# Patient Record
Sex: Female | Born: 1965 | Race: White | Hispanic: No
Health system: Southern US, Community
[De-identification: ages and names within clinical notes are randomized; demographics above are authoritative.]

---

## 1998-11-14 ENCOUNTER — Other Ambulatory Visit: Admission: RE | Admit: 1998-11-14 | Discharge: 1998-11-14 | Payer: Self-pay | Admitting: *Deleted

## 2000-04-30 ENCOUNTER — Other Ambulatory Visit: Admission: RE | Admit: 2000-04-30 | Discharge: 2000-04-30 | Payer: Self-pay | Admitting: Obstetrics and Gynecology

## 2000-11-14 ENCOUNTER — Inpatient Hospital Stay (HOSPITAL_COMMUNITY): Admission: AD | Admit: 2000-11-14 | Discharge: 2000-11-17 | Payer: Self-pay | Admitting: Obstetrics and Gynecology

## 2000-11-14 ENCOUNTER — Encounter (INDEPENDENT_AMBULATORY_CARE_PROVIDER_SITE_OTHER): Payer: Self-pay

## 2000-12-24 ENCOUNTER — Other Ambulatory Visit: Admission: RE | Admit: 2000-12-24 | Discharge: 2000-12-24 | Payer: Self-pay | Admitting: Obstetrics & Gynecology

## 2002-03-07 ENCOUNTER — Other Ambulatory Visit: Admission: RE | Admit: 2002-03-07 | Discharge: 2002-03-07 | Payer: Self-pay | Admitting: Obstetrics & Gynecology

## 2006-03-09 ENCOUNTER — Inpatient Hospital Stay (HOSPITAL_COMMUNITY): Admission: AD | Admit: 2006-03-09 | Discharge: 2006-03-09 | Payer: Self-pay | Admitting: Obstetrics and Gynecology

## 2007-01-11 ENCOUNTER — Encounter: Admission: RE | Admit: 2007-01-11 | Discharge: 2007-02-07 | Payer: Self-pay | Admitting: Family Medicine

## 2007-12-26 ENCOUNTER — Emergency Department (HOSPITAL_COMMUNITY): Admission: EM | Admit: 2007-12-26 | Discharge: 2007-12-26 | Payer: Self-pay | Admitting: Emergency Medicine

## 2008-01-20 ENCOUNTER — Ambulatory Visit: Payer: Self-pay | Admitting: Cardiology

## 2008-02-17 ENCOUNTER — Ambulatory Visit: Payer: Self-pay | Admitting: Cardiology

## 2008-02-17 ENCOUNTER — Ambulatory Visit: Payer: Self-pay

## 2011-01-20 NOTE — Assessment & Plan Note (Signed)
[facility]ALTHCARE                            CARDIOLOGY OFFICE NOTE   NAME:Diana Hardin, Diana Hardin                         MRN:          [id number]  DATE:01/20/2008                            DOB:          07/15/1966    REFERRING PHYSICIAN:  Bethann Berkshire, MD   PRIMARY CARE PHYSICIAN:  None.   REASON FOR PRESENTATION:  Evaluate patient with chest pain.   HISTORY OF PRESENT ILLNESS:  The patient is a pleasant 45 year old white  female without prior cardiac history.  She was seen in the emergency  room on December 26, 2007 and then was referred here by Dr. Estell Harpin.  She  was having chest discomfort.  She says she has had about 3 severe  episodes of this.  She had an episode at work in March.  She had some  chest tightness while seated at her desk.  This went away spontaneously.  She had another episode while getting a manicure on her birthday in  early April.  The most severe was on December 26, 2007.  At that time, she  had chest discomfort with some right arm discomfort.  She tried to drive  to the hospital but felt poorly and so flagged down a fire truck and was  taken by EMS.  She had on that day substernal discomfort.  It was sharp.  It was moderate.  It was lasting for a few hours.  She was quite anxious  with this.  She felt her heart racing.  She felt a little short of  breath.  She thought she was starting to panic.  It started at rest.  She did get some sublingual nitroglycerin in the ambulance.  She said  the discomfort slowly eased.  In the emergency room, she did not have  any discomfort.  She had no acute EKG changes.  One set of cardiac  enzymes were negative.  She was discharged from the emergency room.  The  patient did not have associated nausea, vomiting, or diaphoresis.  This  had been similar to the previous episodes, but she not had it prior to  March.  Since that time, she has not had any of these severe episodes,  but she has had some fleeting chest  discomfort and occasionally a  lingering ache.  She is somewhat active, though she does not excise  routinely.  She last went walking a couple of days ago.  This was not  brisk.  She pushes a vacuum cleaner.  With this kind of activity, she  does not get chest pressure, neck or arm discomfort.  She does not get  palpitations, presyncope, or syncope.  She had no PND or orthopnea.   PAST MEDICAL HISTORY:  1. Questionable mitral valve prolapse diagnosed by echo but felt to be      mild.  2. She has no history of hypertension, diabetes.   PAST SURGICAL HISTORY:  1. D&C.  2. Adenoidectomy.   ALLERGIES:  DARVOCET.   MEDICATIONS:  1. Aspirin 81 mg daily.  2. Multivitamin.  3. Vitamin C.   SOCIAL  HISTORY:  The patient is married.  She has one 63-year-old  daughter.  She is a Runner, broadcasting/film/video.  She smoked for 7 years, less than 1 pack a  day, quitting in 2000.  She drinks alcohol socially.   FAMILY HISTORY:  Contributory for father dying of myocardial infarction  at age 39.  However, he had an abusive lifestyle, abusing alcohol and  cigarettes.  Her mother has had a CVA and is alive in her 59s.  She  apparently was on migraine medications and other medications thought to  be related to her CVA.  She second-degree relatives with later-onset  heart disease.  She comes from a small family.   REVIEW OF SYSTEMS:  As stated in HPI and otherwise negative for other  systems.   PHYSICAL EXAMINATION:  GENERAL:  The patient is in no distress.  VITAL SIGNS:  Blood pressure 117/79, heart rate 91 and regular, body  mass index 24.  HEENT:  Eyelids unremarkable.  Pupils equal, round, and reactive to  light.  Fundi not visualized.  Oral mucosa unremarkable.  NECK:  No jugular distention at 45 degrees.  Carotid upstroke brisk and  symmetric.  No bruits, no thyromegaly.  LYMPHATICS:  No cervical, axillary, or inguinal.  LUNGS:  Clear to auscultation bilaterally.  BACK:  No costovertebral angle tenderness.   CHEST:  Unremarkable.  HEART:  PMI not displaced or sustained, S1-S2 within normal limits.  No  S3-S4.  No clicks, rubs, murmurs.  ABDOMEN:  Flat, positive bowel sounds, normal in frequency and pitch.  No bruits, rebound, guarding, no midline pulsatile mass.  No  hepatosplenomegaly, no splenomegaly.  SKIN:  No rash, no nodules.  EXTREMITIES:  2+ pulses throughout.  No edema, cyanosis or clubbing.  NEUROLOGIC:  Oriented to person, place, time.  Cranial nerves II-XII  grossly intact.  Motor grossly intact.   EKG (done at the ER on December 26, 2007) demonstrated sinus rhythm, axis  within normal limits, intervals within normal limits, questionable left  atrial enlargement, no acute ST-T wave changes.   ASSESSMENT/PLAN:  1. Chest discomfort.  The patient's chest discomfort is somewhat      atypical.  She does have a family history but no other risk      factors.  I think the pretest probability of obstructive coronary      disease is moderately low.  I think screening with a plain old      exercise treadmill (POET) will be helpful.  This will allow me to      rule out obstructive coronary disease with a good degree of      certainty, risk stratify and give her a prescription for exercise.  2. Mitral valve prolapse.  I do not appreciate any click or murmur.  I      do not think any further evaluation is warranted.  3. Palpitations.  The patient does describe some increased heart rate      with these activities.  She describes feeling very anxious.  There      may be a component of anxiety, and she should have this addressed      by a primary care physician or other primary Doneta Bayman, as she      admits to this becoming a more frequent issue.  4. Risk reduction.  5. Dyslipidemia.  The patient reports some mild elevation of low-      density lipoprotein but with a good high-density lipoprotein.  She  is going to try to find these labs, and I will review them.  They      were done about a  year ago.   FOLLOW UP:  Followup will be at the time of her POET.     Rollene Rotunda, MD, [facility]  Electronically Signed    JH/MedQ  DD: 01/20/2008  DT: 01/20/2008  Job #: [id number]   cc:   Bethann Berkshire, MD

## 2011-01-20 NOTE — Procedures (Signed)
[facility] HEALTHCARE                              EXERCISE TREADMILL   NAME:Diana Hardin, Diana Hardin                         MRN:          [id number]  DATE:02/17/2008                            DOB:          December 12, 1965    REFERRING PHYSICIAN:  Bethann Berkshire, MD.   PROCEDURE:  Exercise treadmill test.   INDICATIONS:  Evaluate patient with chest pain.   PROCEDURE NOTE:  The patient was exercised using standard Bruce  protocol.  She was able to exercise for 9 minutes and 30 seconds.  This  achieved 10.9 mets.  This was 30 seconds into stage IV.  The test was  terminated because of fatigue and because she had achieved target heart  rate.  She had a maximal blood pressure of 155/98 which was appropriate.  Her maximum heart rate was 160 which is 89% of predicted.  She had a  normal heart rate recovery.  There re was rare ventricular ectopy at  peak exercise.  There were no ischemic ST/T wave changes.  She had no  chest pain.  She had appropriate shortness of breath.   CONCLUSION:  Negative adequate exercise treadmill test.   PLAN:  Based on the above, no further cardiovascular testing is  suggested.  There is no evidence of high-grade obstructive coronary  artery disease.  I gave her a prescription for exercise and  recommendations.  She can come back to see me if she has any questions  or future problems.     Rollene Rotunda, MD, [facility]  Electronically Signed    JH/MedQ  DD: 02/17/2008  DT: 02/18/2008  Job #: [id number]

## 2011-01-23 NOTE — H&P (Signed)
[facility]  Patient:    Diana Hardin, Diana Hardin                         MRN: [id number] Adm. Date:  [id number] Attending:  Silverio Lay A                         History and Physical  REASON FOR ADMISSION:         Intrauterine pregnancy, at 41 weeks and two days with regular uterine contraction.  HISTORY OF PRESENT ILLNESS:   This is a 45 year old, married, white female, gravida 1, para 0, with a due date of November 05, 2000, being admitted for regular uterine contraction which started about 2:30 this morning.  At the time of the contractions starting, the patient notes a moderate amount of water leaking, has not leaked since.  She has had bloody show since her last examination in the office on Friday where she was 3 cm dilated.  She reports good fetal activity and denies any symptoms of pregnancy-induced hypertension.  PRENATAL COURSE:              Reveals blood type O positive.  RPR nonreactive. Rubella not immune.  HBsAg negative.  HIV negative.  Varicella positive.  Pap smear normal.  Gonorrhea negative.  Chlamydia negative.  A 16-week AFP was within normal limits.  A 21-week ultrasound revealed a normal anatomy survey with a posterior placenta and normal cervical ______.  A 28-week glucose tolerance test was within normal limits.  A 35-week group B strep was positive.  A 40-week ultrasound revealed an estimated fetal weight at 50 seconds percentile and amniotic fluid index borderline at 8.9 or 17th percentile.  Prenatal course was otherwise remarkable for gestational thrombocytopenia evaluated by hematologist and last platelet count on March 1 was 123.  The patient was scheduled for induction on November 12, 2000, but declined.  PAST MEDICAL HISTORY:         Hypersensitivity to DARVOCET.  History of cryotherapy in 1987.  History of mitral valve prolapse, not needing premedication.  FAMILY HISTORY:               Father deceased of a massive myocardial  infarct at age 40.  Maternal grandmother deceased of a myocardial infarction at age 58.  Father was also known for hypertension.  SOCIAL HISTORY:               Married, nonsmoker, is a Runner, broadcasting/film/video at [employer].  PHYSICAL EXAMINATION:  VITAL SIGNS:                  Vital signs normal.  HEENT:                        Head, eyes, ears, nose, and throat negative.  LUNGS:                        Clear.  HEART:                        Normal.  ABDOMEN:                      Gravid, nontender.  Fetal heart rate tracings reactive.  PELVIC:  Vaginal examination upon admission 6-7 cm by admitting nurse, 100% effaced, vertex, -1-0.  EXTREMITIES:                  Negative.  ASSESSMENT:                   Intrauterine pregnancy, at 41 weeks and two days, in active labor, rubella not immune.  PLAN:                         The patient is admitted to labor and delivery today with vaginal delivery expected.  Pain management. DD:  11/14/00 TD:  11/14/00 Job: [id number]

## 2011-01-23 NOTE — Op Note (Signed)
[facility]  Patient:    VICKEE, MORMINO                         MRN: [id number] Adm. Date:  [id number] Attending:  Silverio Lay A                           Operative Report  PREOPERATIVE DIAGNOSIS:       Postpartum retained placenta.  POSTOPERATIVE DIAGNOSIS:      Placenta accreta with postoperative uterine atony.  SURGEON:                      Silverio Lay, M.D.  ANESTHESIA:                   MAC.  ANESTHESIOLOGIST:             Ellison Hughs., M.D.  PROCEDURE:                    Manual removal of placenta with curettage.  ESTIMATED BLOOD LOSS:         1000 cc.  DESCRIPTION OF PROCEDURE:     This is a 45 year old married white female, gravida 1, with a due date of November 05, 2000 who was admitted in continuous labor at 41 weeks and 2 days with a pregnancy which was complicated by gestational thrombocytopenia and a history of group B Strep positive.  her labor progressed rapidly and she was completed by 8:45 a.m. this morning. Second stage of labor was greatly prolonged and, after 1-1/2 hours of pushing with little progress, the patient was offered a saddle block without any complications.  At the time, her platelet count was 80,000.  After resting for almost an hour, she started pushing again four 1 hour and 45 minutes but, after a small application of vacuum extraction, we assisted the spontaneous vaginal delivery of a female infant at 1:12 with Apgars of 9 and 9.  A midline episiotomy was repaired with Monocryl 3-0 and we awaited delivery of the placenta.  After one hour, despite uterine massage and good contractions, and despite Valsalva maneuvers and nursing, the placenta remained attached.  The patient showed no active bleeding and vital signs remained normal.  Due to retained placenta, the decision was made to go to the operating room to proceed with manual removal and curettage under MAC sedation.  Informed consent was obtained  after reviewing the possible complications with both the patient and her husband regarding bleeding and infection.  The patient was taken to OR #4, was given IV sedation and was placed in the lithotomy position.  She was prepped and draped in a sterile fashion and a Foley catheter was placed.  I proceeded with attempts to manually remove the placenta, which now clearly was placenta accreta on an average surface of about 25%.  Removal was extremely difficult, and it was impossible to be completely assured that all placental tissue was removed.  At the end of removal, the placenta was reconstituted on the table and seemed to be complete.  We then proceeded with a very large curet to curet all uterine walls, which removed a small amount of membranes, but no frank placenta. After this was done, the patient started bleeding heavily from uterine atony. Her existing IV was reinforced with 40 units of Pitocin at high rate and she was given one dose  of Methergine in her left thigh.  After ten minutes, bleeding was still substantial and she was given a dose of Hemabate in her right thigh.  This medical therapy, coupled with aggressive bimanual uterine massage resolved the active bleeding and the uterus remained firm.  The patient was then taken to the PACU and a Stat CBC, DIC panel, typed and crossed was ordered.  Estimated blood loss during the procedure was about 1000 cc.  Instrument and sponge count was complete x 2. DD:  11/14/00 TD:  11/15/00 Job: [id number]

## 2011-06-02 LAB — DIFFERENTIAL
Basophils Absolute: 0
Basophils Relative: 0
Neutro Abs: 5.6
Neutrophils Relative %: 80 — ABNORMAL HIGH

## 2011-06-02 LAB — POCT I-STAT, CHEM 8
Creatinine, Ser: 0.8
HCT: 38
Hemoglobin: 12.9
Potassium: 4.5
Sodium: 137

## 2011-06-02 LAB — CBC
MCHC: 34.2
RBC: 3.73 — ABNORMAL LOW
RDW: 13.4

## 2011-06-02 LAB — POCT CARDIAC MARKERS
CKMB, poc: <1 — ABNORMAL LOW
Myoglobin, poc: 23.7

## 2011-06-02 LAB — D-DIMER, QUANTITATIVE: D-Dimer, Quant: <0.22

## 2018-05-24 ENCOUNTER — Other Ambulatory Visit: Payer: Self-pay | Admitting: Family Medicine

## 2018-05-24 DIAGNOSIS — R51 Headache: Principal | ICD-10-CM

## 2018-05-24 DIAGNOSIS — R519 Headache, unspecified: Secondary | ICD-10-CM

## 2019-06-02 ENCOUNTER — Other Ambulatory Visit: Payer: Self-pay

## 2019-06-02 DIAGNOSIS — Z20822 Contact with and (suspected) exposure to covid-19: Secondary | ICD-10-CM

## 2019-06-03 LAB — NOVEL CORONAVIRUS, NAA: SARS-CoV-2, NAA: NOT DETECTED

## 2019-09-14 ENCOUNTER — Telehealth: Payer: Self-pay | Admitting: Physician Assistant

## 2019-09-14 DIAGNOSIS — Z20822 Contact with and (suspected) exposure to covid-19: Secondary | ICD-10-CM

## 2019-09-14 NOTE — Progress Notes (Signed)
E-Visit for Corona Virus Screening   Your current symptoms could be consistent with the coronavirus.  Many health care providers can now test patients at their office but not all are.  [facility] has multiple testing sites. For information on our COVID testing locations and hours go to [email or web address]  We are enrolling you in our MyChart Home Monitoring for COVID19 . Daily you will receive a questionnaire within the MyChart website. Our COVID 19 response team will be monitoring your responses daily.  Testing Information: The COVID-19 Community Testing sites will begin testing BY APPOINTMENT ONLY.  You can schedule online at [email or web address]  If you do not have access to a smart phone or computer you may call [phone] for an appointment.  Testing Locations: Appointment schedule is 8 am to 3:30 pm at all sites  [place] indoors at [address], [place] [address] [facility]  indoors at [address] Vis[address]trance, [place], [place] [address] [place] indoors at [address], [place] [address]  Additional testing sites in the Community:  . For [facility] Testing sites in [place] [employer]  https://www.cvs.com/minuteclinic/covid-19-testing  . For Pop-up testing sites in [place]  https://covid19.ncdhhs.gov/about-covid-19/testing/find-my-testing-place/pop-testing-sites  . For Testing sites with regular hours https://onsms.org/[place]/  . For [place] https://[facility].com/covid-19-community-outreach-testing/  . For [facility] https://www.[place].gov[place]/human-services/health-department/coronavirus-covid-19-info/covid-19-testing  . For [place] testing in [place] https://www.[place].gov/[place]/human-services/health-department/coronavirus-covid-19-info/covid-19-testing  . For [facility] testing in [place]   https://[facility].care/covidtesting  For  more  information about community testing call [phone]   We are enrolling you in our MyChart Home Monitoring for COVID19 . Daily you will receive a questionnaire within the MyChart website. Our COVID 19 response team will be monitoring your responses daily.  Please quarantine yourself while awaiting your test results. If you develop fever/cough/breathlessness, please stay home for 10 days with improving symptoms and until you have had 24 hours of no fever (without taking a fever reducer).  You should wear a mask or cloth face covering over your nose and mouth if you must be around other people or animals, including pets (even at home). Try to stay at least 6 feet away from other people. This will protect the people around you.  Please continue good preventive care measures, including:  frequent hand-washing, avoid touching your face, cover coughs/sneezes, stay out of crowds and keep a 6 foot distance from others.  COVID-19 is a respiratory illness with symptoms that are similar to the flu. Symptoms are typically mild to moderate, but there have been cases of severe illness and death due to the virus.   The following symptoms may appear 2-14 days after exposure: . Fever . Cough . Shortness of breath or difficulty breathing . Chills . Repeated shaking with chills . Muscle pain . Headache . Sore throat . New loss of taste or smell . Fatigue . Congestion or runny nose . Nausea or vomiting . Diarrhea  Go to the nearest hospital ED for assessment if fever/cough/breathlessness are severe or illness seems like a threat to life.  It is vitally important that if you feel that you have an infection such as this virus or any other virus that you stay home and away from places where you may spread it to others.  You should avoid contact with people age 65 and older.   You may also take acetaminophen (Tylenol) as needed for fever.  Reduce your risk of any infection by using the same precautions used for    avoiding the common cold or flu:  . Wash your hands often with soap and warm water for at least 20 seconds.  If soap and water are not readily available, use an alcohol-based hand sanitizer with at least 60% alcohol.  . If coughing or sneezing, cover your mouth and nose by coughing or sneezing into the elbow areas of your shirt or coat, into a tissue or into your sleeve (not your hands). . Avoid shaking hands with others and consider head nods or verbal greetings only. . Avoid touching your eyes, nose, or mouth with unwashed hands.  . Avoid close contact with people who are sick. . Avoid places or events with large numbers of people in one location, like concerts or sporting events. . Carefully consider travel plans you have or are making. . If you are planning any travel outside or inside the [place], visit the CDC's Travelers' Health webpage for the latest health notices. . If you have some symptoms but not all symptoms, continue to monitor at home and seek medical attention if your symptoms worsen. . If you are having a medical emergency, call 911.  HOME CARE . Only take medications as instructed by your medical team. . Drink plenty of fluids and get plenty of rest. . A steam or ultrasonic humidifier can help if you have congestion.   GET HELP RIGHT AWAY IF YOU HAVE EMERGENCY WARNING SIGNS** FOR COVID-19. If you or someone is showing any of these signs seek emergency medical care immediately. Call 911 or proceed to your closest emergency facility if: . You develop worsening high fever. . Trouble breathing . Bluish lips or face . Persistent pain or pressure in the chest . New confusion . Inability to wake or stay awake . You cough up blood. . Your symptoms become more severe  **This list is not all possible symptoms. Contact your medical provider for any symptoms that are sever or concerning to you.  MAKE SURE YOU   Understand these instructions.  Will watch your condition.  Will get  help right away if you are not doing well or get worse.  Your e-visit answers were reviewed by a board certified advanced clinical practitioner to complete your personal care plan.  Depending on the condition, your plan could have included both over the counter or prescription medications.  If there is a problem please reply once you have received a response from your provider.  Your safety is important to us.  If you have drug allergies check your prescription carefully.    You can use MyChart to ask questions about today's visit, request a non-urgent call back, or ask for a work or school excuse for 24 hours related to this e-Visit. If it has been greater than 24 hours you will need to follow up with your provider, or enter a new e-Visit to address those concerns. You will get an e-mail in the next two days asking about your experience.  I hope that your e-visit has been valuable and will speed your recovery. Thank you for using e-visits.    

## 2019-09-18 ENCOUNTER — Ambulatory Visit: Payer: Self-pay | Attending: Internal Medicine

## 2019-09-18 DIAGNOSIS — Z20822 Contact with and (suspected) exposure to covid-19: Secondary | ICD-10-CM

## 2019-09-19 LAB — NOVEL CORONAVIRUS, NAA: SARS-CoV-2, NAA: NOT DETECTED

## 2021-04-15 ENCOUNTER — Other Ambulatory Visit: Payer: Self-pay | Admitting: Family Medicine

## 2021-04-15 ENCOUNTER — Ambulatory Visit
Admission: RE | Admit: 2021-04-15 | Discharge: 2021-04-15 | Disposition: A | Discharge status: Home / Self Care | Payer: 59 | Source: Ambulatory Visit | Attending: Family Medicine | Admitting: Family Medicine

## 2021-04-15 ENCOUNTER — Other Ambulatory Visit: Payer: Self-pay

## 2021-04-15 DIAGNOSIS — M542 Cervicalgia: Secondary | ICD-10-CM

## 2022-01-31 IMAGING — CR DG CERVICAL SPINE COMPLETE 4+V
5 series · 5 of 5 positions shown · non-contrast
Comparison: None.

CLINICAL DATA: Cervical spine pain with left radicular symptoms

EXAM:
CERVICAL SPINE - COMPLETE 4+ VIEW

[w cervical spine lat]
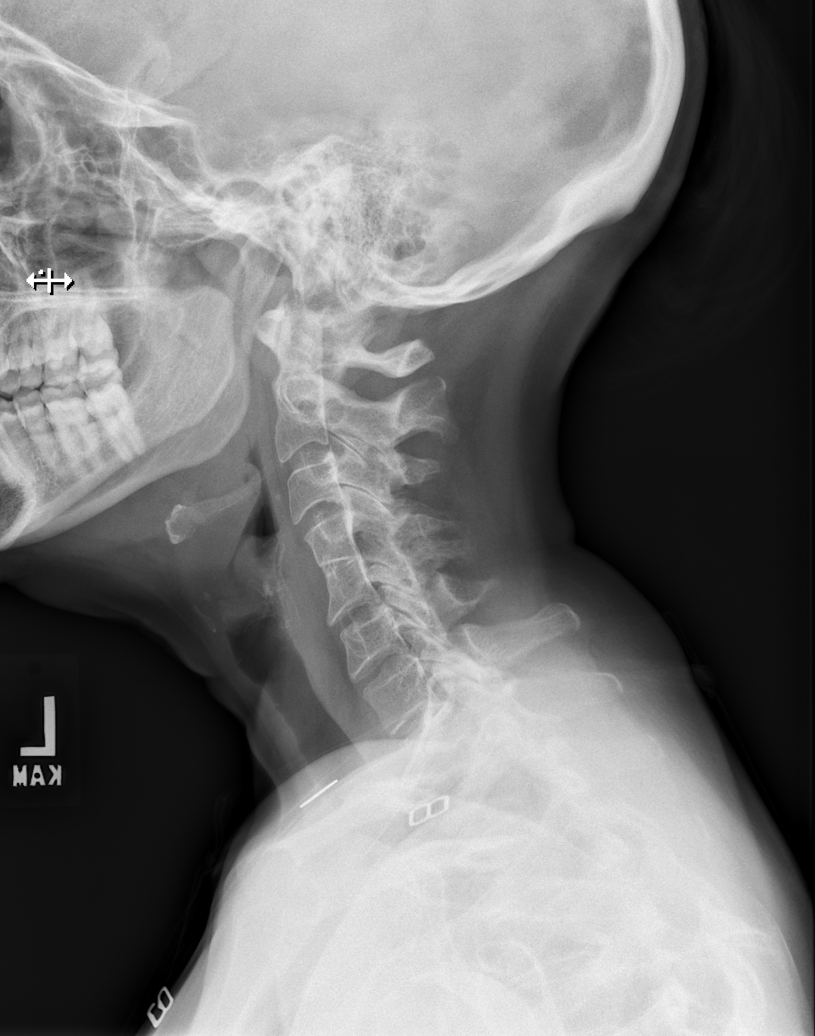

[w cervical spine ap_obl (1 of 2)]
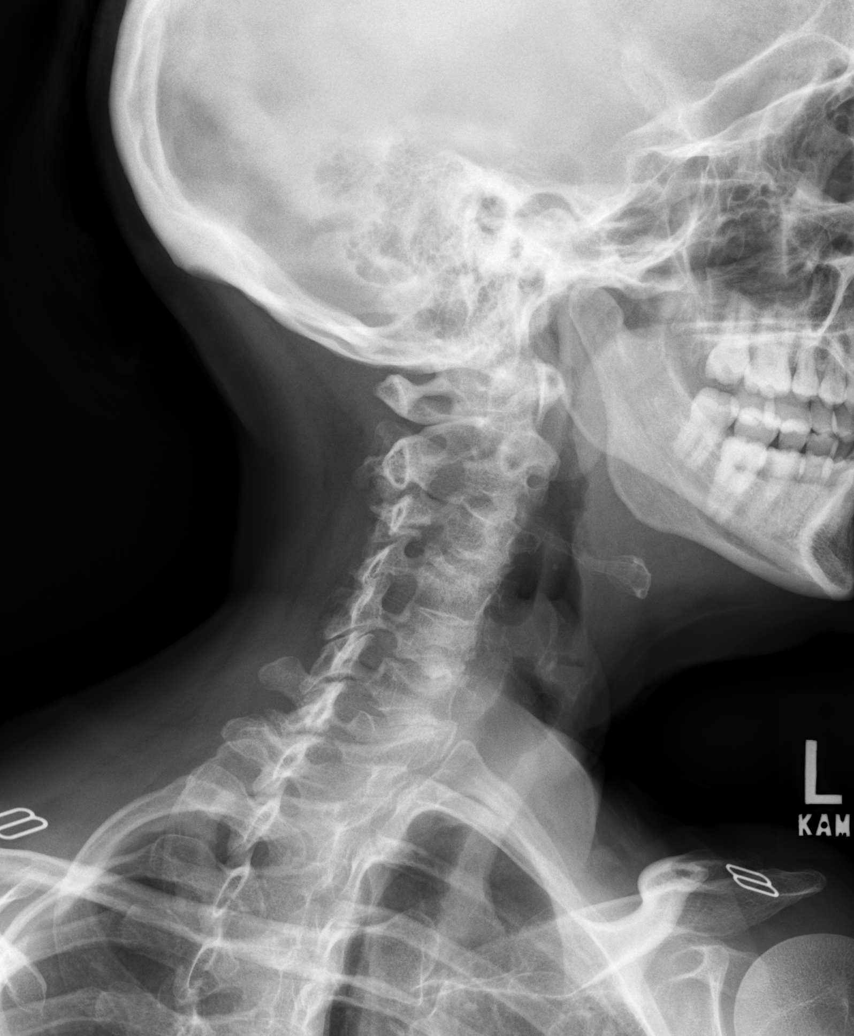

[w cervical spine ap_obl (2 of 2)]
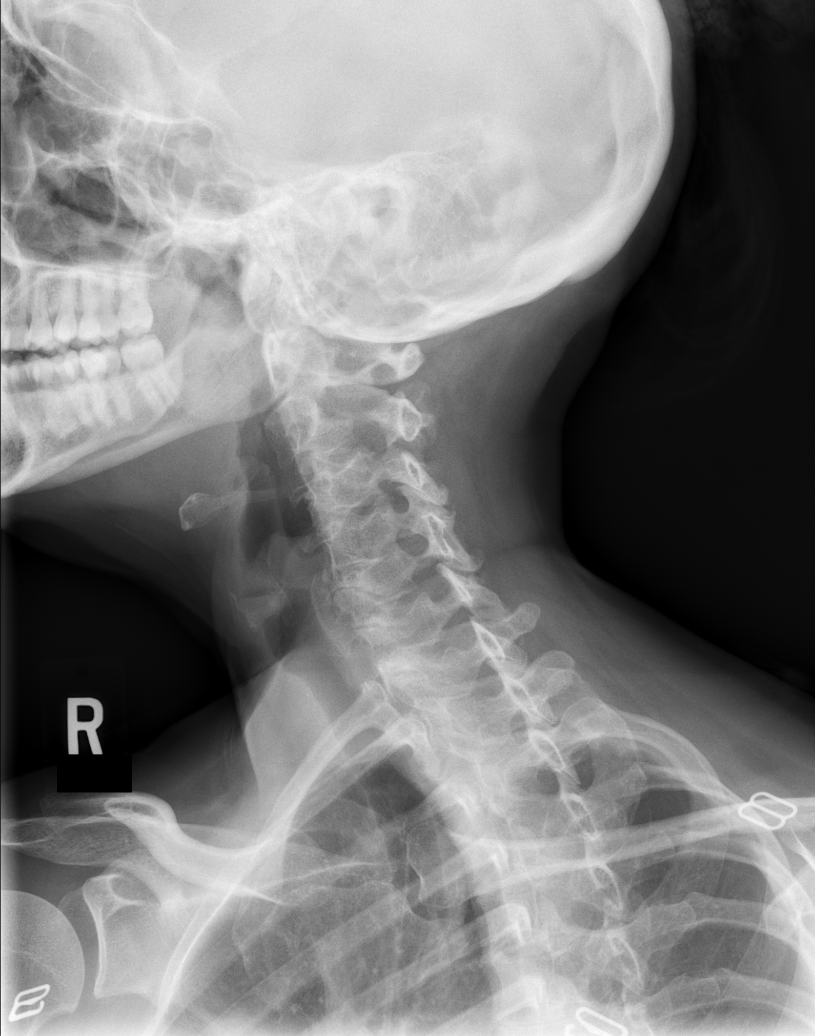

[w cervical spine ap]
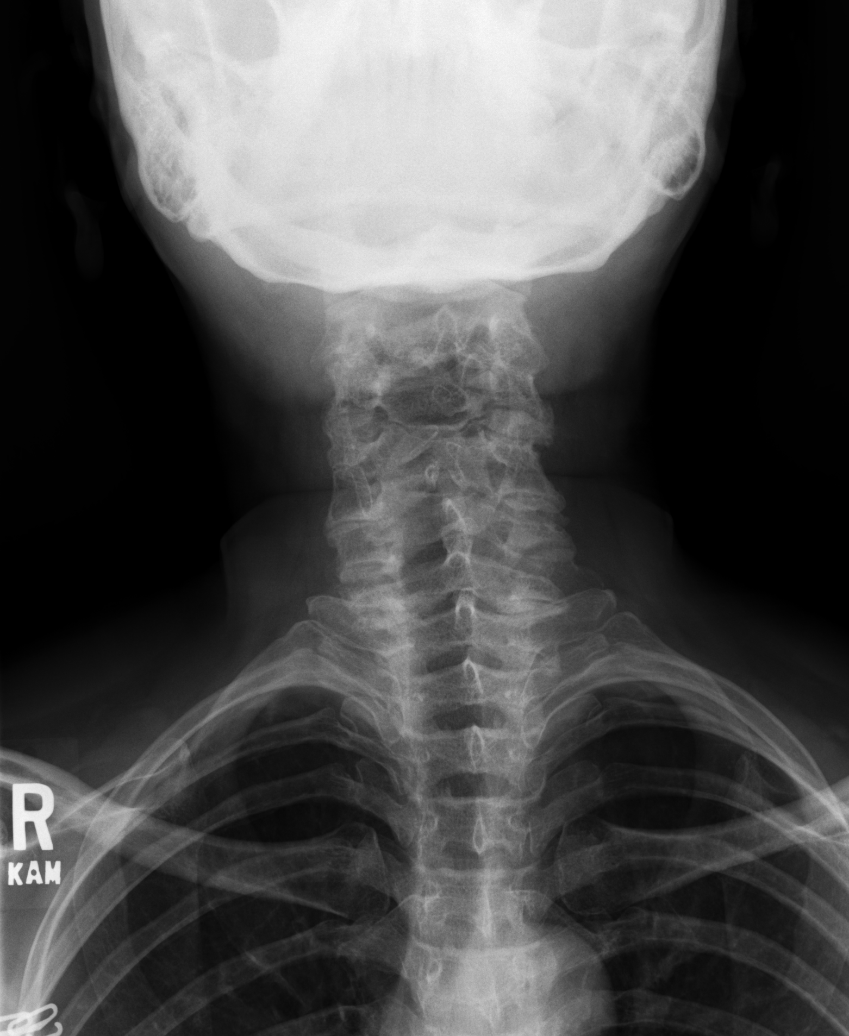

[w cervical spine odontoid]
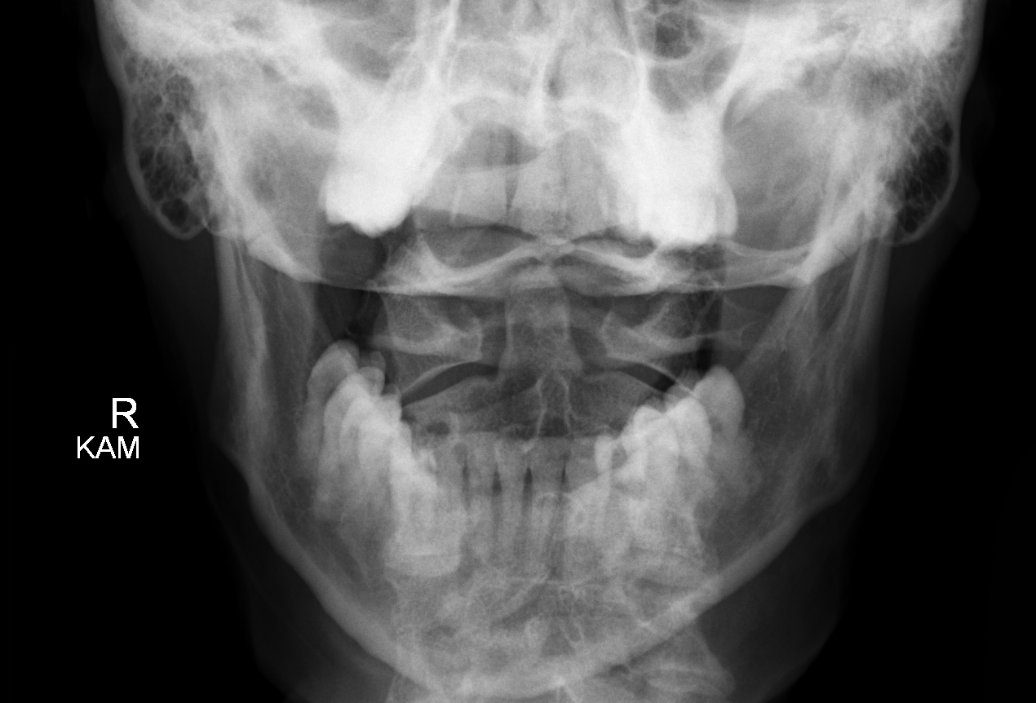

[5 of 5 positions shown; findings below may reference images not displayed]

FINDINGS: The cervical spine is imaged through the C7 vertebral body.

There is abnormal morphology of the C4 and C5 vertebral bodies with
partial fusion. The remaining vertebral body heights are preserved.
Alignment is normal. The intervertebral disc spaces are preserved.

There is mild multilevel uncovertebral and facet arthropathy
resulting in mild neural foraminal stenosis bilaterally at C3-C4.
The remaining osseous neural foramina are patent.

The prevertebral soft tissues are normal.
IMPRESSION: 1. Partial fusion of the mildly morphologically abnormal C4 and C5
vertebral bodies consistent with Marvim anomaly.
2. Mild degenerative changes as above with mild neural foraminal
stenosis at C3-C4.

## 2022-06-23 ENCOUNTER — Other Ambulatory Visit: Payer: Self-pay | Admitting: Obstetrics and Gynecology

## 2022-06-23 DIAGNOSIS — N6320 Unspecified lump in the left breast, unspecified quadrant: Secondary | ICD-10-CM

## 2022-06-24 ENCOUNTER — Other Ambulatory Visit: Payer: Self-pay | Admitting: Obstetrics and Gynecology

## 2022-06-24 DIAGNOSIS — N63 Unspecified lump in unspecified breast: Secondary | ICD-10-CM

## 2022-06-25 ENCOUNTER — Other Ambulatory Visit: Payer: Self-pay | Admitting: Obstetrics and Gynecology

## 2022-06-25 DIAGNOSIS — N6320 Unspecified lump in the left breast, unspecified quadrant: Secondary | ICD-10-CM

## 2022-07-09 ENCOUNTER — Ambulatory Visit
Admission: RE | Admit: 2022-07-09 | Discharge: 2022-07-09 | Disposition: A | Discharge status: Home / Self Care | Payer: Managed Care, Other (non HMO) | Source: Ambulatory Visit | Attending: Obstetrics and Gynecology | Admitting: Obstetrics and Gynecology

## 2022-07-09 ENCOUNTER — Other Ambulatory Visit: Payer: Self-pay | Admitting: Obstetrics and Gynecology

## 2022-07-09 ENCOUNTER — Ambulatory Visit
Admission: RE | Admit: 2022-07-09 | Discharge: 2022-07-09 | Disposition: A | Discharge status: Home / Self Care | Payer: 59 | Source: Ambulatory Visit | Attending: Obstetrics and Gynecology | Admitting: Obstetrics and Gynecology

## 2022-07-09 DIAGNOSIS — N63 Unspecified lump in unspecified breast: Secondary | ICD-10-CM

## 2022-07-09 DIAGNOSIS — N6320 Unspecified lump in the left breast, unspecified quadrant: Secondary | ICD-10-CM

## 2024-04-04 ENCOUNTER — Other Ambulatory Visit: Payer: Self-pay | Admitting: Obstetrics and Gynecology

## 2024-04-04 DIAGNOSIS — Z1231 Encounter for screening mammogram for malignant neoplasm of breast: Secondary | ICD-10-CM

## 2024-07-25 ENCOUNTER — Ambulatory Visit
